# Patient Record
Sex: Female | Born: 1990 | Race: Black or African American | Hispanic: No | Marital: Single | Smoking: Former smoker
Health system: Southern US, Community
[De-identification: ages and names within clinical notes are randomized; demographics above are authoritative.]

## PROBLEM LIST (undated history)

## (undated) DIAGNOSIS — F419 Anxiety disorder, unspecified: Secondary | ICD-10-CM

## (undated) HISTORY — PX: BREAST REDUCTION SURGERY: SHX8

## (undated) HISTORY — DX: Anxiety disorder, unspecified: F41.9

## (undated) HISTORY — PX: ANKLE FRACTURE SURGERY: SHX122

---

## 2015-06-07 HISTORY — PX: REDUCTION MAMMAPLASTY: SUR839

## 2019-09-28 ENCOUNTER — Emergency Department (HOSPITAL_COMMUNITY): Payer: BC Managed Care – PPO

## 2019-09-28 ENCOUNTER — Emergency Department (HOSPITAL_COMMUNITY)
Admission: EM | Admit: 2019-09-28 | Discharge: 2019-09-28 | Disposition: A | Discharge status: Home / Self Care | Payer: BC Managed Care – PPO | Attending: Emergency Medicine | Admitting: Emergency Medicine

## 2019-09-28 ENCOUNTER — Other Ambulatory Visit: Payer: Self-pay

## 2019-09-28 ENCOUNTER — Encounter (HOSPITAL_COMMUNITY): Payer: Self-pay | Admitting: Family Medicine

## 2019-09-28 DIAGNOSIS — S8991XA Unspecified injury of right lower leg, initial encounter: Secondary | ICD-10-CM | POA: Diagnosis present

## 2019-09-28 DIAGNOSIS — Y9369 Activity, other involving other sports and athletics played as a team or group: Secondary | ICD-10-CM | POA: Diagnosis not present

## 2019-09-28 DIAGNOSIS — Y92838 Other recreation area as the place of occurrence of the external cause: Secondary | ICD-10-CM | POA: Diagnosis not present

## 2019-09-28 DIAGNOSIS — Y999 Unspecified external cause status: Secondary | ICD-10-CM | POA: Insufficient documentation

## 2019-09-28 DIAGNOSIS — Y9302 Activity, running: Secondary | ICD-10-CM | POA: Insufficient documentation

## 2019-09-28 DIAGNOSIS — T148XXA Other injury of unspecified body region, initial encounter: Secondary | ICD-10-CM

## 2019-09-28 DIAGNOSIS — S8251XA Displaced fracture of medial malleolus of right tibia, initial encounter for closed fracture: Secondary | ICD-10-CM | POA: Diagnosis not present

## 2019-09-28 DIAGNOSIS — W172XXA Fall into hole, initial encounter: Secondary | ICD-10-CM | POA: Diagnosis not present

## 2019-09-28 DIAGNOSIS — S82831A Other fracture of upper and lower end of right fibula, initial encounter for closed fracture: Secondary | ICD-10-CM | POA: Diagnosis not present

## 2019-09-28 DIAGNOSIS — S9304XA Dislocation of right ankle joint, initial encounter: Secondary | ICD-10-CM | POA: Insufficient documentation

## 2019-09-28 DIAGNOSIS — S9306XA Dislocation of unspecified ankle joint, initial encounter: Secondary | ICD-10-CM

## 2019-09-28 LAB — CBC
HCT: 38.6 % (ref 36.0–46.0)
Hemoglobin: 12.7 g/dL (ref 12.0–15.0)
MCH: 28.8 pg (ref 26.0–34.0)
MCHC: 32.9 g/dL (ref 30.0–36.0)
MCV: 87.5 fL (ref 80.0–100.0)
Platelets: 260 10*3/uL (ref 150–400)
RBC: 4.41 MIL/uL (ref 3.87–5.11)
RDW: 14.3 % (ref 11.5–15.5)
WBC: 8.1 10*3/uL (ref 4.0–10.5)
nRBC: 0 % (ref 0.0–0.2)

## 2019-09-28 LAB — I-STAT BETA HCG BLOOD, ED (MC, WL, AP ONLY): I-stat hCG, quantitative: <5 m[IU]/mL (ref <5)

## 2019-09-28 MED ORDER — PROPOFOL 10 MG/ML IV BOLUS
INTRAVENOUS | Status: AC
  Administered 2019-09-28: 200 mg via INTRAVENOUS
  Filled 2019-09-28: qty 20

## 2019-09-28 MED ORDER — MORPHINE SULFATE (PF) 4 MG/ML IV SOLN
4.0000 mg | Freq: Once | INTRAVENOUS | Status: AC
  Administered 2019-09-28: 4 mg via INTRAVENOUS
  Filled 2019-09-28: qty 1

## 2019-09-28 MED ORDER — PROPOFOL 10 MG/ML IV BOLUS
0.5000 mg/kg | Freq: Once | INTRAVENOUS | Status: AC
  Filled 2019-09-28: qty 20

## 2019-09-28 MED ORDER — OXYCODONE HCL 5 MG PO TABS: 5.0000 mg | ORAL_TABLET | ORAL | 0 refills | Status: DC | PRN

## 2019-09-28 MED ORDER — SODIUM CHLORIDE 0.9 % IV BOLUS
500.0000 mL | Freq: Once | INTRAVENOUS | Status: AC
  Administered 2019-09-28: 500 mL via INTRAVENOUS

## 2019-09-28 MED ORDER — OXYCODONE HCL 5 MG PO CAPS: 5.0000 mg | ORAL_CAPSULE | ORAL | 0 refills | Status: DC | PRN

## 2019-09-28 NOTE — ED Notes (Signed)
Consent has been done, all preparations for conscious sedation has been done.

## 2019-09-28 NOTE — Progress Notes (Signed)
Orthopedic Tech Progress Note Patient Details:  Janet Vega 1990-07-17 [id number]  Ortho Devices Type of Ortho Device: Post (short leg) splint, Stirrup splint Ortho Device/Splint Location: rle. applied post reduction with drs assist. Ortho Device/Splint Interventions: Ordered, Application, Adjustment   Post Interventions Patient Tolerated: Well Instructions Provided: Care of device, Adjustment of device   Trinna Post 09/28/2019, 7:44 PM

## 2019-09-28 NOTE — Discharge Instructions (Signed)
Please follow up with orthopedics within 1 week.

## 2019-09-28 NOTE — ED Provider Notes (Signed)
[facility] EMERGENCY DEPARTMENT Provider Note   CSN: [id number] Arrival date & time: 09/28/19  1623     History No chief complaint on file.   Janet Vega is a 29 y.o. female.  Patient is an otherwise healthy 29 y.o female presenting for ankle injury.  Injury occurred this afternoon while patient was play paintball.  Patient reports that she was running to try and avoid being hit when she fell into a ditch and rolled her ankle inwards.  States she felt it pop out of place and then significant pain.  Denies any syncope but states that she started to cry immediately and was in shock.  She looked down at her ankle and realized it was dislocated so came straight to the emergency department.  Denies any other symptoms other than pain.  States she can still feel her foot and when others touch her foot.  Is still able to move her toes.        History reviewed. No pertinent past medical history.  There are no problems to display for this patient.   History reviewed. No pertinent surgical history.   OB History   No obstetric history on file.     History reviewed. No pertinent family history.  Social History   Tobacco Use  . Smoking status: Not on file  Substance Use Topics  . Alcohol use: Not on file  . Drug use: Not on file    Home Medications Prior to Admission medications   Medication Sig Start Date End Date Taking? Authorizing Provider  oxycodone (OXY-IR) 5 MG capsule Take 1 capsule (5 mg total) by mouth every 4 (four) hours as needed. 09/28/19   Oralia Manis, DO    Allergies    Patient has no allergy information on record.  Review of Systems   Review of Systems  Musculoskeletal: Positive for arthralgias.  Neurological: Negative for syncope.    Physical Exam Updated Vital Signs BP 121/89   Pulse 84   Temp 98.8 F (37.1 C) (Oral)   Resp 18   Ht 5\' 5"  (1.651 m)   Wt 83 kg   SpO2 100%   BMI 30.45 kg/m   Physical Exam  Constitutional:      General: She is not in acute distress.    Appearance: She is normal weight.  HENT:     Head: Normocephalic.     Nose: Nose normal.  Eyes:     Conjunctiva/sclera: Conjunctivae normal.  Cardiovascular:     Rate and Rhythm: Normal rate and regular rhythm.     Pulses: Normal pulses.     Heart sounds: No murmur. No friction rub. No gallop.   Pulmonary:     Effort: Pulmonary effort is normal. No respiratory distress.     Breath sounds: Normal breath sounds. No wheezing, rhonchi or rales.  Abdominal:     General: Bowel sounds are normal.     Tenderness: There is no abdominal tenderness.  Musculoskeletal:        General: Tenderness and deformity present.     Right foot: Decreased range of motion. Deformity and tenderness present.     Comments: Patient with right ankle dislocation. Able to feel thready pedal pulse. Tenderness to palpation of ankle as well as directly above malleolus.   Neurological:     Mental Status: She is alert.     ED Results / Procedures / Treatments   Labs (all labs ordered are listed, but only abnormal results are displayed) Labs  Reviewed  CBC  I-STAT BETA HCG BLOOD, ED ([facility], [facility], [facility] ONLY)    EKG None  Radiology DG Tibia/Fibula Right Port  Result Date: 09/28/2019 CLINICAL DATA:  Slipped and fell EXAM: PORTABLE RIGHT TIBIA AND FIBULA - 2 VIEW COMPARISON:  None. FINDINGS: Frontal and lateral views of the right tibia and fibula are obtained. There is dorsal and lateral dislocation of the talus relative to the tibial plafond. Comminuted displaced posterior and lateral malleolar fractures are seen. The remainder of the tibia and fibula appear unremarkable. There is diffuse soft tissue swelling about the ankle. IMPRESSION: 1. Comminuted lateral malleolar fracture. 2. Comminuted posterior malleolar tibial fracture. 3. Diffuse soft tissue swelling about the ankle. Electronically Signed   By: Sharlet Salina M.D.   On: 09/28/2019 18:10   DG Ankle  Right Port  Result Date: 09/28/2019 CLINICAL DATA:  Reduction of right ankle fracture EXAM: PORTABLE RIGHT ANKLE - 2 VIEW COMPARISON:  September 28, 2019 FINDINGS: There is been interval reduction of the patient's ankle fractures and dislocation. The patient is in a cast limiting fine cortical detail. There is mild displacement of the fibular fracture best seen on the lateral view, significantly improved in the interval. No other acute abnormalities. IMPRESSION: Reduction of right ankle fractures as above. Electronically Signed   By: Gerome Sam III M.D   On: 09/28/2019 20:11   DG Ankle Right Port  Result Date: 09/28/2019 CLINICAL DATA:  Slipped and fell EXAM: PORTABLE RIGHT ANKLE - 2 VIEW COMPARISON:  None. FINDINGS: Frontal and lateral views of the right ankle are obtained. There is a comminuted oblique lateral malleolar fracture. Coronally oriented posterior malleolar fracture also noted. There is dorsal and lateral dislocation of the talus relative to the tibial plafond. I cannot exclude a small nondisplaced avulsion fracture off the medial malleolus. Repeat imaging after reduction of dislocation is recommended. There is diffuse soft tissue edema. IMPRESSION: 1. Dorsal and lateral dislocation of the tibiotalar joint. 2. Comminuted displaced lateral and posterior malleolar fractures. Suspect medial malleolar avulsion fracture. Repeat imaging recommended after reduction. Electronically Signed   By: Sharlet Salina M.D.   On: 09/28/2019 18:09   DG Foot 2 Views Right  Result Date: 09/28/2019 CLINICAL DATA:  Slipped and fell EXAM: RIGHT FOOT - 2 VIEW COMPARISON:  None. FINDINGS: Frontal and lateral views of the right foot demonstrate dorsal and lateral dislocation of the talus from the tibial plafond. Comminuted lateral and posterior malleolar fractures are seen. No other displaced right foot fractures. Diffuse soft tissue swelling of the hindfoot. IMPRESSION: 1. Dislocation of the tibiotalar joint. 2.  Lateral and posterior malleolar fractures. Electronically Signed   By: Sharlet Salina M.D.   On: 09/28/2019 18:11    Procedures Procedures (including critical care time)  Medications Ordered in ED Medications  morphine 4 MG/ML injection 4 mg (4 mg Intravenous Given 09/28/19 1704)  propofol (DIPRIVAN) 10 mg/mL bolus/IV push 41.5 mg (200 mg Intravenous Given 09/28/19 1920)  morphine 4 MG/ML injection 4 mg (4 mg Intravenous Given 09/28/19 1844)  morphine 4 MG/ML injection 4 mg (4 mg Intravenous Given 09/28/19 1950)  sodium chloride 0.9 % bolus 500 mL (500 mLs Intravenous New Bag/Given 09/28/19 1954)    ED Course  I have reviewed the triage vital signs and the nursing notes.  Pertinent labs & imaging results that were available during my care of the patient were reviewed by me and considered in my medical decision making (see chart for details).    MDM Rules/Calculators/A&P  Patient with acute right ankle dislocation.  Continues to be in pain.  Will give IV morphine 4 mg.  We will plan to reevaluate after morphine dose for better exam.  We will also obtain portable films of ankle, foot, tib-fib.  Patient will likely need reduction.  X-rays will determine if it is done in the ED versus the OR.  Discussed this with patient.  Will reevaluate after pain reduction and x-rays.  20:31 Patient tolerated reduction well. See separate procedure note from Dr. Dallie Piles. Repeat imaging showing reduction was successful. Patient currently sitting up and oriented. Reports no dizziness. Tolerating PO. Reports some pain but tolerable per patient. Discussed close f/u with orthopedics and ED return precautions. Patient's friend will drive her. Will give short course of #10 pills oxycodone 5mg  and follow with orthopedics for further management. Crutches provided for patient prior to discharge.   Patient discussed with Dr. Reather Converse who also saw patient and assisted with reduction. Reduction performed by  Dr. Dallie Piles  Final Clinical Impression(s) / ED Diagnoses Final diagnoses:  Dislocation closed  Dislocation of right ankle joint, initial encounter  Ankle dislocation  Closed fracture of distal end of right fibula, unspecified fracture morphology, initial encounter    Rx / DC Orders ED Discharge Orders         Ordered    oxycodone (OXY-IR) 5 MG capsule  Every 4 hours PRN     09/28/19 2030           Caroline More, DO 09/28/19 2037    Elnora Morrison, MD 09/29/19 [phone]

## 2019-09-28 NOTE — ED Provider Notes (Signed)
.Sedation  Date/Time: 09/28/2019 7:51 PM Performed by: Filbert Berthold, MD Authorized by: Elnora Morrison, MD   Consent:    Consent obtained:  Verbal and written   Consent given by:  Patient   Risks discussed:  Allergic reaction, dysrhythmia, inadequate sedation, vomiting, respiratory compromise necessitating ventilatory assistance and intubation, prolonged hypoxia resulting in organ damage and nausea   Alternatives discussed:  Analgesia without sedation and regional anesthesia Universal protocol:    Procedure explained and questions answered to patient or proxy's satisfaction: yes     Relevant documents present and verified: yes     Test results available and properly labeled: yes     Imaging studies available: yes     Required blood products, implants, devices, and special equipment available: yes     Site/side marked: yes     Immediately prior to procedure a time out was called: yes     Patient identity confirmation method:  Arm band and verbally with patient Indications:    Procedure performed:  Fracture reduction   Procedure necessitating sedation performed by:  Physician performing sedation Pre-sedation assessment:    Time since last food or drink:  6   ASA classification: class 1 - normal, healthy patient     Neck mobility: normal     Mouth opening:  3 or more finger widths   Thyromental distance:  3 finger widths   Mallampati score:  I - soft palate, uvula, fauces, pillars visible   Pre-sedation assessments completed and reviewed: airway patency, cardiovascular function, hydration status, mental status, nausea/vomiting, pain level and respiratory function     Pre-sedation assessment completed:  09/28/2019 7:02 PM Immediate pre-procedure details:    Reassessment: Patient reassessed immediately prior to procedure     Reviewed: vital signs     Verified: bag valve mask available, emergency equipment available, intubation equipment available, IV patency confirmed, oxygen available  and suction available   Procedure details (see MAR for exact dosages):    Preoxygenation:  Nasal cannula   Sedation:  Propofol   Intended level of sedation: deep   Analgesia:  Morphine   Intra-procedure monitoring:  Cardiac monitor, blood pressure monitoring, continuous capnometry, continuous pulse oximetry, frequent vital sign checks and frequent LOC assessments   Total Provider sedation time (minutes):  25 Post-procedure details:    Post-sedation assessment completed:  09/28/2019 8:03 PM   Attendance: Constant attendance by certified staff until patient recovered     Recovery: Patient returned to pre-procedure baseline     Post-sedation assessments completed and reviewed: airway patency, cardiovascular function, hydration status, mental status, nausea/vomiting, pain level and respiratory function     Patient is stable for discharge or admission: yes     Patient tolerance:  Tolerated well, no immediate complications  Reduction of dislocation  Date/Time: 09/28/2019 8:04 PM Performed by: Filbert Berthold, MD Authorized by: Elnora Morrison, MD  Consent: Verbal consent obtained. Written consent obtained. Risks and benefits: risks, benefits and alternatives were discussed Consent given by: patient Patient understanding: patient states understanding of the procedure being performed Patient consent: the patient's understanding of the procedure matches consent given Procedure consent: procedure consent matches procedure scheduled Relevant documents: relevant documents present and verified Test results: test results available and properly labeled Site marked: the operative site was marked Imaging studies: imaging studies available Required items: required blood products, implants, devices, and special equipment available Patient identity confirmed: verbally with patient and arm band Time out: Immediately prior to procedure a "time out" was called to verify the  correct patient, procedure,  equipment, support staff and site/side marked as required. Local anesthesia used: no  Anesthesia: Local anesthesia used: no  Sedation: Patient sedated: yes Sedatives: propofol Analgesia: morphine Vitals: Vital signs were monitored during sedation.  Patient tolerance: patient tolerated the procedure well with no immediate complications Comments: R ankle fracture dislocation reduction.        Gracy Bruins, MD 09/28/19 Willaim Sheng    Blane Ohara, MD 09/29/19 [phone]

## 2019-09-28 NOTE — ED Notes (Signed)
Radiology was contacted about pt being ready for her xrays.

## 2019-09-28 NOTE — ED Notes (Signed)
Patient verbalizes understanding of discharge instructions. Opportunity for questioning and answers were provided. Armband removed by staff, pt discharged from ED ambulatory.   

## 2022-02-04 IMAGING — DX DG FOOT 2V*R*
2 series · 2 of 2 positions shown · non-contrast
Comparison: None.

CLINICAL DATA: Slipped and fell

EXAM:
RIGHT FOOT - 2 VIEW

[foot ap]
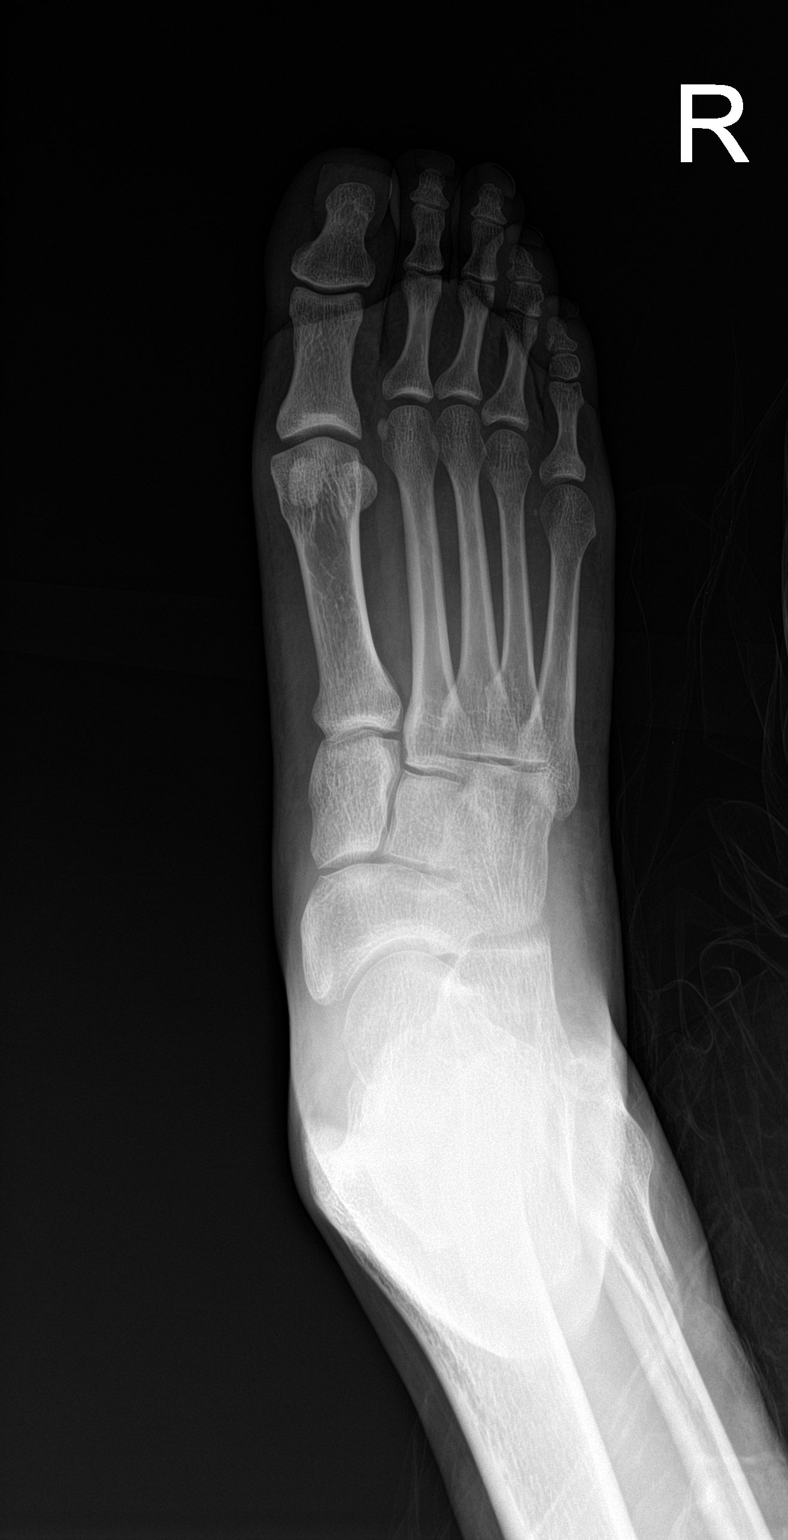

[foot lat]
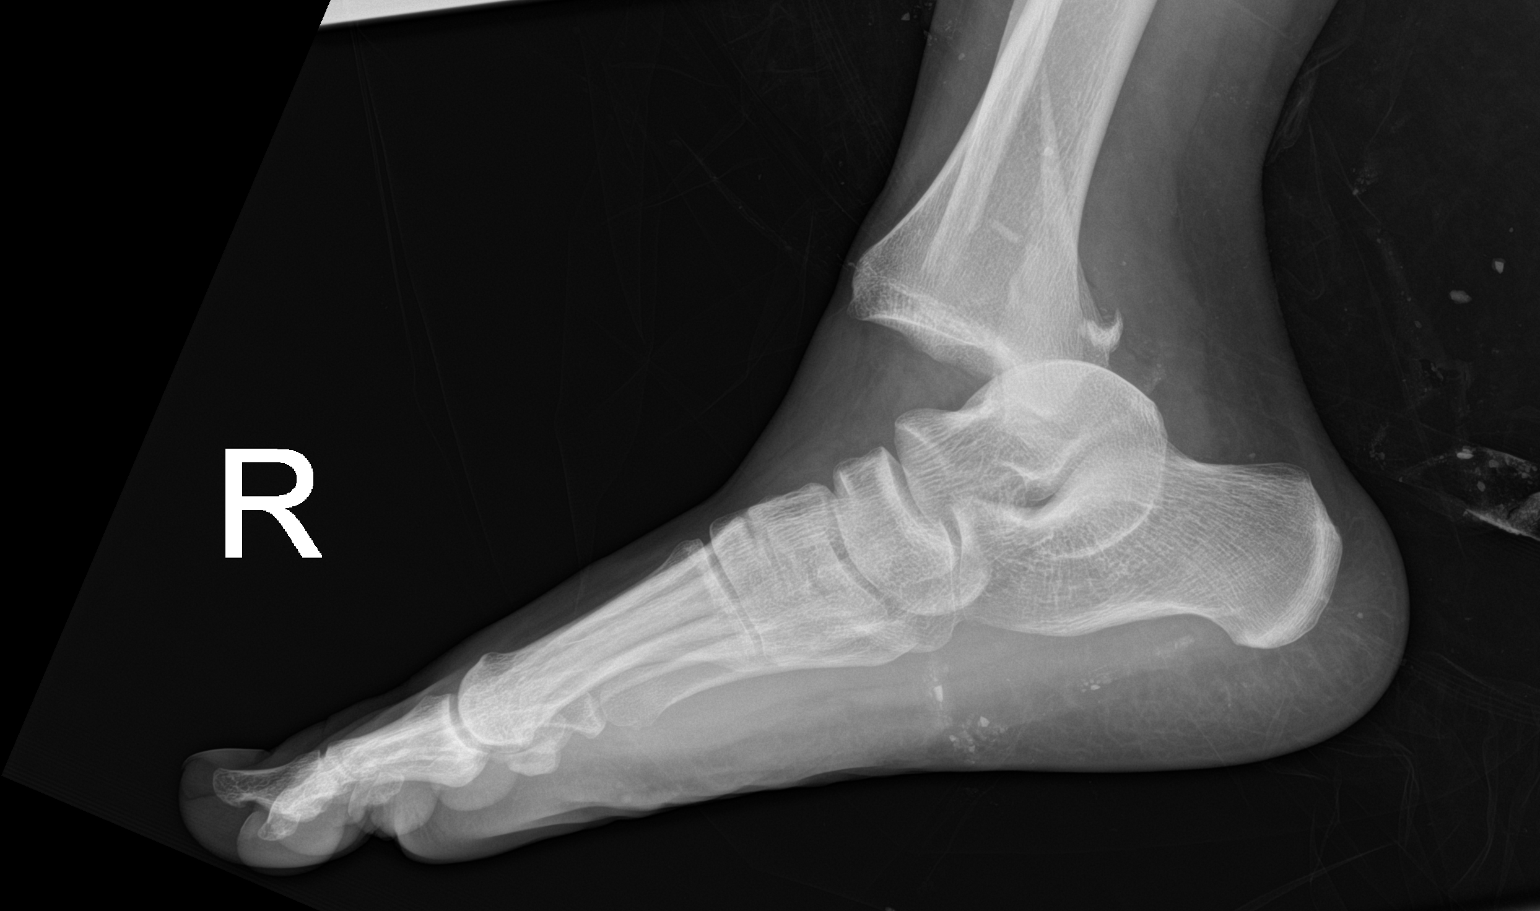

[2 of 2 positions shown; findings below may reference images not displayed]

FINDINGS: Frontal and lateral views of the right foot demonstrate dorsal and
lateral dislocation of the talus from the tibial plafond. Comminuted
lateral and posterior malleolar fractures are seen. No other
displaced right foot fractures. Diffuse soft tissue swelling of the
hindfoot.
IMPRESSION: 1. Dislocation of the tibiotalar joint.
2. Lateral and posterior malleolar fractures.

## 2022-06-24 ENCOUNTER — Telehealth: Payer: Self-pay | Admitting: Gastroenterology

## 2022-06-24 NOTE — Telephone Encounter (Signed)
Good morning Dr. Bryan Lemma,  Supervising MD for today 1/19 AM    We have received records from patients previous [facility] practice [place]. Patient stated she is wanting to transfer her care due to feeling like she is not getting the care or the answers that she needs. Patient stated she is having issues with acid reflux. Patients records include her office visits from June of 2023 until October of 2023. I will be sending patients records for you to review, will you please review and advise on scheduling?   Thank you.

## 2022-06-28 ENCOUNTER — Encounter: Payer: Self-pay | Admitting: Gastroenterology

## 2022-06-28 NOTE — Telephone Encounter (Signed)
Records received and reviewed and notable for the following:  - 03/16/2022: Follow-up appointment in GI clinic for ongoing evaluation/treatment of heartburn and chronic idiopathic constipation.  At that time she was taking Protonix 20 mg twice daily.  Had previously trialed OTC omeprazole.  Index reflux symptoms include heartburn and hoarseness.  Per notes was having efficacy with Protonix bid, to include resolution of hoarseness.    For constipation, index symptoms of type 2-3 Bristol stools approximately 3 days/week with straining to have BM.  Improvement with OTC stool softeners.  Was using Dulcolax on demand.  Symptoms improved with fiber supplement, and by the time of her follow-up appointment in October, was no longer requiring Dulcolax.  Never trialed the prescribed Linzess.  Does have anxiety with regards to her GI symptoms.  Ok to schedule OV for transfer of care with continued evaluation and treatment of reflux.  Per the limited available notes, does not appear that she ever had upper endoscopy or colonoscopy.  Please verify that this is the case.  If she has had either of those, please obtain those records for review prior to appointment.

## 2022-06-28 NOTE — Telephone Encounter (Signed)
Dr. Bryan Lemma,   I spoke with patient and she stated she had never had a colonoscopy or endoscopy!  Patient was scheduled for OV on 2/27 at 2:20

## 2022-08-02 ENCOUNTER — Other Ambulatory Visit (INDEPENDENT_AMBULATORY_CARE_PROVIDER_SITE_OTHER): Payer: BC Managed Care – PPO

## 2022-08-02 ENCOUNTER — Encounter: Payer: Self-pay | Admitting: Gastroenterology

## 2022-08-02 ENCOUNTER — Ambulatory Visit: Payer: BC Managed Care – PPO | Admitting: Gastroenterology

## 2022-08-02 VITALS — BP 110/70 | HR 96 | Ht 65.0 in | Wt 182.2 lb

## 2022-08-02 DIAGNOSIS — K921 Melena: Secondary | ICD-10-CM

## 2022-08-02 DIAGNOSIS — R1084 Generalized abdominal pain: Secondary | ICD-10-CM

## 2022-08-02 DIAGNOSIS — K219 Gastro-esophageal reflux disease without esophagitis: Secondary | ICD-10-CM

## 2022-08-02 DIAGNOSIS — R195 Other fecal abnormalities: Secondary | ICD-10-CM

## 2022-08-02 DIAGNOSIS — R12 Heartburn: Secondary | ICD-10-CM

## 2022-08-02 DIAGNOSIS — R142 Eructation: Secondary | ICD-10-CM

## 2022-08-02 DIAGNOSIS — R933 Abnormal findings on diagnostic imaging of other parts of digestive tract: Secondary | ICD-10-CM

## 2022-08-02 LAB — C-REACTIVE PROTEIN: CRP: <1 mg/dL (ref 0.5–20.0)

## 2022-08-02 LAB — SEDIMENTATION RATE: Sed Rate: 42 mm/hr — ABNORMAL HIGH (ref 0–20)

## 2022-08-02 NOTE — Progress Notes (Signed)
Chief Complaint: GERD, heartburn, abdominal pain, change in bowel habits   Referring Provider:     Ozella Rocks, MD    HPI:     Janet Vega is a 32 y.o. female with a history of anxiety referred to the Gastroenterology Clinic for second opinion and evaluation of GERD and heartburn, but also with her bowel habits as below.  GI sxs started somewhat abruptly in 10/2021.  A few months prior to that, had started colon cleanse monthly with laxatives and protein shakes (5/day) as an attempt for overall health improvement and weight loss. Reflux sxs started to worsen (belching, heartburn). Started acid suppression meds. Went to ER in June 2023, was diagnosed with GERD, treated w/ GI cocktail. Then started to notice intermittent BRB mixed in the stool. Stool cards were + in 1/3. BRB resolved for a few months then recurred a few months later, described as BRB mixed in and on stool. Last episode was last month.  Tried fiber supplement    Previously following with [facility]ena Goes in [place], but transitioning care here due to ongoing GI symptoms. - 03/16/2022: Follow-up appointment in GI clinic for ongoing evaluation/treatment of heartburn and chronic idiopathic constipation.  At that time she was taking Protonix 20 mg twice daily.  Had previously trialed OTC omeprazole.  Index reflux symptoms include heartburn and hoarseness.  Per notes was having efficacy with Protonix bid, to include resolution of hoarseness.      -07/03/2022: ER evaluation for abdominal pain.  Described as intermittent crampy, upper abdominal pain since 10/2021.  Unremarkable, HD stable.       -Normal lipase, CMP, CBC       -Urine culture with E. coli infection       -CT A/P: Mild wall thickening in the sigmoid colon without pericolonic stranding.  May represent nonspecific colitis.  Otherwise normal GI tract.  Hepatic steatosis.       -Was treated with GI cocktail with improvement and discharged with  Cipro 500 mg bid x 7 days for E. coli UTI  Can have AM globus sensation, which tends to improve during the day. Has been taking pantoprazole 20 mg since ER.   Does not have constipation. Stools are formed, and no straining to have BM.  She does have periodic loose/watery stools.   Continues to have generalized abdominal pain.  Does have significant anxiety with regards to her GI symptoms.    No previous EGD/Colonoscopy.   No known family history of CRC, GI malignancy, liver disease, pancreatic disease, or IBD.    Past Medical History:  Diagnosis Date   Anxiety      Past Surgical History:  Procedure Laterality Date   ANKLE FRACTURE SURGERY Right    BREAST REDUCTION SURGERY     Family History  Problem Relation Age of Onset   Glaucoma Mother    Gallstones Mother    Diabetes Father    Hypertension Father    Glaucoma Brother    Bladder Cancer Maternal Grandmother    Diabetes Maternal Grandmother    Heart disease Maternal Grandfather    Breast cancer Paternal Grandmother    Cirrhosis Paternal Grandfather    Social History   Tobacco Use   Smoking status: Former    Types: Cigarettes    Quit date: 2021    Years since quitting: 3.1   Smokeless tobacco: Never   Tobacco comments:    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     Flaxseed, Linseed, (FLAX SEED OIL PO) Take 1 tablet by mouth as needed.     Multiple Vitamin (MULTIVITAMIN) capsule Take 1 capsule by mouth as needed.     pantoprazole (PROTONIX) 20 MG tablet Take 20 mg by mouth daily.     TURMERIC PO Take 1 tablet by mouth as needed.     No current facility-administered medications for this visit.   No Known Allergies   Review of Systems: All systems reviewed and negative except  where noted in HPI.     Physical Exam:    Wt Readings from Last 3 Encounters:  08/02/22 182 lb 4 oz (82.7 kg)  09/28/19 183 lb (83 kg)    BP 110/70 (BP Location: Left Arm, Patient Position: Sitting, Cuff Size: Normal)   Pulse 96   Ht '5\' 5"'$  (1.651 m)   Wt 182 lb 4 oz (82.7 kg)   LMP 07/29/2022   BMI 30.33 kg/m  Constitutional:  Pleasant, in no acute distress. Psychiatric: Normal mood and affect. Behavior is normal. EENT: Pupils normal.  Conjunctivae are normal. No scleral icterus. Neck supple. No cervical LAD. Cardiovascular: Normal rate, regular rhythm. No edema Pulmonary/chest: Effort normal and breath sounds normal. No wheezing, rales or rhonchi. Abdominal: Soft, nondistended, nontender. Bowel sounds active throughout. There are no masses palpable. No hepatomegaly. Neurological: Alert and oriented to person place and time. Skin: Skin is warm and dry. No rashes noted.   ASSESSMENT AND PLAN;   1) Heartburn 2) Belching 3) Upper abdominal pain 4) Change in bowel habits 5) Scant hematochezia 6) Lower abdominal pain 7) Abnormal imaging study 8) Heme positive stool  Discussed the broad DDx for presenting symptoms, to include primary GI pathologies along with potential secondary symptomatology from underlying anxiety.  Symptoms are certainly causing her significant distress, and she would very strongly like to proceed with endoscopic evaluation.  Plan as follows:  - Check ESR, CRP, fecal calprotectin, fecal pancreatic elastase - EGD with gastric and duodenal biopsies - Colonoscopy with random and directed biopsies - Ok to continue pantoprazole 20 mg daily  The indications, risks, and benefits of EGD and colonoscopy were explained to the patient in detail. Risks include but are not limited to bleeding, perforation, adverse reaction to medications, and cardiopulmonary compromise. Sequelae include but are not limited to the possibility of surgery, hospitalization, and mortality.  The patient verbalized understanding and wished to proceed. All questions answered, referred to scheduler and bowel prep ordered. Further recommendations pending results of the exam.     [place], DO, FACG  08/02/2022, 2:24 PM   No ref. provider found

## 2022-08-02 NOTE — Patient Instructions (Addendum)
Your provider has requested that you go to the basement level for lab work before leaving today. Press "B" on the elevator. The lab is located at the first door on the left as you exit the elevator.   We have given you a sample of Clenpiq for Colonoscopy prep. _______________________________________________________  If your blood pressure at your visit was 140/90 or greater, please contact your primary care physician to follow up on this.  You have been scheduled for an endoscopy and colonoscopy. Please follow the written instructions given to you at your visit today. Please pick up your prep supplies at the pharmacy within the next 1-3 days. If you use inhalers (even only as needed), please bring them with you on the day of your procedure.   ______________________________________________________  If you are age 52 or younger, your body mass index should be between 19-25. Your Body mass index is 30.33 kg/m. If this is out of the aformentioned range listed, please consider follow up with your Primary Care Provider.   __________________________________________________________  The [facility] providers would like to encourage you to use [facility] to communicate with providers for non-urgent requests or questions.  Due to long hold times on the telephone, sending your provider a message by [facility] may be a faster and more efficient way to get a response.  Please allow 48 business hours for a response.  Please remember that this is for non-urgent requests.   Due to recent changes in healthcare laws, you may see the results of your imaging and laboratory studies on MyChart before your provider has had a chance to review them.  We understand that in some cases there may be results that are confusing or concerning to you. Not all laboratory results come back in the same time frame and the provider may be waiting for multiple results in order to interpret others.  Please give [place] 48 hours in order for your  provider to thoroughly review all the results before contacting the office for clarification of your results.    Thank you for choosing me and [facility].  Vito Cirigliano, D.O.

## 2022-08-04 ENCOUNTER — Other Ambulatory Visit: Payer: BC Managed Care – PPO

## 2022-08-04 DIAGNOSIS — R1084 Generalized abdominal pain: Secondary | ICD-10-CM

## 2022-08-04 DIAGNOSIS — R195 Other fecal abnormalities: Secondary | ICD-10-CM

## 2022-08-04 DIAGNOSIS — K219 Gastro-esophageal reflux disease without esophagitis: Secondary | ICD-10-CM

## 2022-08-04 DIAGNOSIS — R12 Heartburn: Secondary | ICD-10-CM

## 2022-08-04 DIAGNOSIS — K921 Melena: Secondary | ICD-10-CM

## 2022-08-09 LAB — CALPROTECTIN, FECAL: Calprotectin, Fecal: 25 ug/g (ref 0–120)

## 2022-08-10 ENCOUNTER — Encounter: Payer: Self-pay | Admitting: Gastroenterology

## 2022-08-10 ENCOUNTER — Ambulatory Visit (AMBULATORY_SURGERY_CENTER): Payer: BC Managed Care – PPO | Admitting: Gastroenterology

## 2022-08-10 VITALS — BP 104/62 | HR 75 | Temp 98.6°F | Resp 18 | Ht 65.0 in | Wt 182.0 lb

## 2022-08-10 DIAGNOSIS — R1084 Generalized abdominal pain: Secondary | ICD-10-CM

## 2022-08-10 DIAGNOSIS — R142 Eructation: Secondary | ICD-10-CM

## 2022-08-10 DIAGNOSIS — K219 Gastro-esophageal reflux disease without esophagitis: Secondary | ICD-10-CM | POA: Diagnosis not present

## 2022-08-10 DIAGNOSIS — F458 Other somatoform disorders: Secondary | ICD-10-CM

## 2022-08-10 DIAGNOSIS — K2950 Unspecified chronic gastritis without bleeding: Secondary | ICD-10-CM

## 2022-08-10 DIAGNOSIS — R195 Other fecal abnormalities: Secondary | ICD-10-CM

## 2022-08-10 DIAGNOSIS — K921 Melena: Secondary | ICD-10-CM | POA: Diagnosis not present

## 2022-08-10 DIAGNOSIS — K640 First degree hemorrhoids: Secondary | ICD-10-CM

## 2022-08-10 MED ORDER — SODIUM CHLORIDE 0.9 % IV SOLN: 500.0000 mL | Freq: Once | INTRAVENOUS | Status: DC

## 2022-08-10 NOTE — Progress Notes (Signed)
Called to room to assist during endoscopic procedure.  Patient ID and intended procedure confirmed with present staff. Received instructions for my participation in the procedure from the performing physician.  

## 2022-08-10 NOTE — Progress Notes (Unsigned)
GASTROENTEROLOGY PROCEDURE H&P NOTE   Primary Care Physician: Patient, No Pcp Per    Reason for Procedure:   GERD, heartburn, abdominal pain, change in bowel habits, hematochezia, globus sensation, belching, FOBT+ stools  Plan:    EGD, colonoscopy  Patient is appropriate for endoscopic procedure(s) in the ambulatory ([facility]) setting.  The nature of the procedure, as well as the risks, benefits, and alternatives were carefully and thoroughly reviewed with the patient. Ample time for discussion and questions allowed. The patient understood, was satisfied, and agreed to proceed.     HPI: Janet Vega is a 32 y.o. female who presents for EGD and colonoscopy for evaluation of GERD, heartburn, abdominal pain, change in bowel habits.  Patient was most recently seen in the Gastroenterology Clinic on 08/02/2022 by me.  No interval change in medical history since that appointment. Please refer to that note for full details regarding GI history and clinical presentation.   Past Medical History:  Diagnosis Date   Anxiety     Past Surgical History:  Procedure Laterality Date   ANKLE FRACTURE SURGERY Right    BREAST REDUCTION SURGERY      Prior to Admission medications   Medication Sig Start Date End Date Taking? Authorizing Provider  ELDERBERRY PO Take 1 tablet by mouth as needed.   Yes [provider]  Flaxseed, Linseed, (FLAX SEED OIL PO) Take 1 tablet by mouth as needed.   Yes [provider]  Multiple Vitamin (MULTIVITAMIN) capsule Take 1 capsule by mouth as needed.   Yes [provider]  TURMERIC PO Take 1 tablet by mouth as needed.   Yes [provider]  levonorgestrel (MIRENA, 52 MG,) 20 MCG/DAY IUD Device supplied by [employer]. 07/28/22   [provider]  pantoprazole (PROTONIX) 20 MG tablet Take 20 mg by mouth daily.    [provider]    Current Outpatient Medications  Medication Sig Dispense Refill   ELDERBERRY PO Take  1 tablet by mouth as needed.     Flaxseed, Linseed, (FLAX SEED OIL PO) Take 1 tablet by mouth as needed.     Multiple Vitamin (MULTIVITAMIN) capsule Take 1 capsule by mouth as needed.     TURMERIC PO Take 1 tablet by mouth as needed.     levonorgestrel (MIRENA, 52 MG,) 20 MCG/DAY IUD Device supplied by [employer].     pantoprazole (PROTONIX) 20 MG tablet Take 20 mg by mouth daily.     Current Facility-Administered Medications  Medication Dose Route Frequency Provider Last Rate Last Admin   0.9 %  sodium chloride infusion  500 mL Intravenous Once Elina Streng V, DO        Allergies as of 08/10/2022   (No Known Allergies)    Family History  Problem Relation Age of Onset   Glaucoma Mother    Gallstones Mother    Diabetes Father    Hypertension Father    Glaucoma Brother    Bladder Cancer Maternal Grandmother    Diabetes Maternal Grandmother    Heart disease Maternal Grandfather    Breast cancer Paternal Grandmother    Cirrhosis Paternal Grandfather     Social History   Socioeconomic History   Marital status: Single    Spouse name: Not on file   Number of children: 1   Years of education: Not on file   Highest education level: Not on file  Occupational History   Occupation: project managment  Tobacco Use   Smoking status: Former  Types: Cigarettes    Quit date: 2021    Years since quitting: 3.1   Smokeless tobacco: Never   Tobacco comments:    College years  Vaping Use   Vaping Use: Some days   Devices: Hooka  Substance and Sexual Activity   Alcohol use: Yes    Comment: occasional   Drug use: Never   Sexual activity: Not on file  Other Topics Concern   Not on file  Social History Narrative   Not on file   Social Determinants of Health   Financial Resource Strain: Not on file  Food Insecurity: Not on file  Transportation Needs: Not on file  Physical Activity: Not on file  Stress: Not on file  Social Connections: Not on file  Intimate Partner  Violence: Not on file    Physical Exam: Vital signs in last 24 hours: '@BP'$  139/80   Pulse 89   Temp 98.6 F (37 C)   Ht '5\' 5"'$  (1.651 m)   Wt 182 lb (82.6 kg)   LMP 07/29/2022   SpO2 100%   BMI 30.29 kg/m  GEN: NAD EYE: Sclerae anicteric ENT: MMM CV: Non-tachycardic Pulm: CTA b/l GI: Soft, NT/ND NEURO:  Alert & Oriented x 3   Gerrit Heck, DO [facility]   08/10/2022 11:32 AM

## 2022-08-10 NOTE — Op Note (Signed)
[place] Patient Name: Janet Vega Procedure Date: 08/10/2022 11:35 AM MRN: [id number] Endoscopist: Gerrit Heck , MD, [id number] Age: 32 Referring MD:  Date of Birth: March 24, 1991 Gender: Female Account #: [id number] Procedure:                Colonoscopy Indications:              Hematochezia, Heme positive stool, Change in bowel                            habits, Loose stools/Diarrhea Medicines:                Monitored Anesthesia Care Procedure:                Pre-Anesthesia Assessment:                           - Prior to the procedure, a History and Physical                            was performed, and patient medications and                            allergies were reviewed. The patient's tolerance of                            previous anesthesia was also reviewed. The risks                            and benefits of the procedure and the sedation                            options and risks were discussed with the patient.                            All questions were answered, and informed consent                            was obtained. Prior Anticoagulants: The patient has                            taken no anticoagulant or antiplatelet agents. ASA                            Grade Assessment: II - A patient with mild systemic                            disease. After reviewing the risks and benefits,                            the patient was deemed in satisfactory condition to                            undergo the procedure.  After obtaining informed consent, the colonoscope                            was passed under direct vision. Throughout the                            procedure, the patient's blood pressure, pulse, and                            oxygen saturations were monitored continuously. The                            Olympus Colonoscope [id number] was introduced through                            the anus and advanced to  the 10 cm into the ileum.                            The colonoscopy was performed without difficulty.                            The patient tolerated the procedure well. The                            quality of the bowel preparation was excellent. The                            terminal ileum, ileocecal valve, appendiceal                            orifice, and rectum were photographed. Scope In: 11:49:27 AM Scope Out: 12:03:29 PM Scope Withdrawal Time: 0 hours 9 minutes 51 seconds  Total Procedure Duration: 0 hours 14 minutes 2 seconds  Findings:                 Skin tags were found on perianal exam.                           The colon (entire examined portion) appeared                            normal. Biopsies for histology were taken with a                            cold forceps from the right colon and left colon                            for evaluation of microscopic colitis. Estimated                            blood loss was minimal.                           Non-bleeding internal hemorrhoids were found during  retroflexion. The hemorrhoids were small.                           The terminal ileum appeared normal. Complications:            No immediate complications. Estimated Blood Loss:     Estimated blood loss was minimal. Impression:               - Perianal skin tags found on perianal exam.                           - The entire examined colon is normal. Biopsied.                           - Non-bleeding internal hemorrhoids.                           - The examined portion of the ileum was normal. Recommendation:           - Patient has a contact number available for                            emergencies. The signs and symptoms of potential                            delayed complications were discussed with the                            patient. Return to normal activities tomorrow.                            Written discharge instructions  were provided to the                            patient.                           - Resume previous diet.                           - Continue present medications.                           - Await pathology results.                           - Repeat colonoscopy at age 35 for screening                            purposes.                           - Return to GI office PRN.                           - Use fiber, for example Citrucel, Fibercon, [employer]  or Metamucil.                           - Trial course of OTC IBGuard. Gerrit Heck, MD 08/10/2022 12:12:09 PM

## 2022-08-10 NOTE — Op Note (Signed)
[place] Patient Name: Janet Vega Procedure Date: 08/10/2022 11:36 AM MRN: [id number] Endoscopist: Gerrit Heck , MD, [id number] Age: 32 Referring MD:  Date of Birth: 15-Sep-1990 Gender: Female Account #: [id number] Procedure:                Upper GI endoscopy Indications:              Heartburn, Suspected esophageal reflux, Change in                            stools, Globus sensation, Belching Medicines:                Monitored Anesthesia Care Procedure:                Pre-Anesthesia Assessment:                           - Prior to the procedure, a History and Physical                            was performed, and patient medications and                            allergies were reviewed. The patient's tolerance of                            previous anesthesia was also reviewed. The risks                            and benefits of the procedure and the sedation                            options and risks were discussed with the patient.                            All questions were answered, and informed consent                            was obtained. Prior Anticoagulants: The patient has                            taken no anticoagulant or antiplatelet agents. ASA                            Grade Assessment: II - A patient with mild systemic                            disease. After reviewing the risks and benefits,                            the patient was deemed in satisfactory condition to                            undergo the procedure.  After obtaining informed consent, the endoscope was                            passed under direct vision. Throughout the                            procedure, the patient's blood pressure, pulse, and                            oxygen saturations were monitored continuously. The                            Olympus Scope [id number] was introduced through the                            mouth, and  advanced to the third part of duodenum.                            The upper GI endoscopy was accomplished without                            difficulty. The patient tolerated the procedure                            well. Scope In: Scope Out: Findings:                 The examined esophagus was normal.                           The Z-line was regular and was found 35 cm from the                            incisors.                           The entire examined stomach was normal. Biopsies                            were taken with a cold forceps for Helicobacter                            pylori testing. Estimated blood loss was minimal.                           The examined duodenum was normal. Biopsies were                            taken with a cold forceps for histology. Estimated                            blood loss was minimal. Complications:            No immediate complications. Estimated Blood Loss:     Estimated blood loss was minimal. Impression:               -  Normal esophagus.                           - Z-line regular, 35 cm from the incisors.                           - Normal stomach. Biopsied.                           - Normal examined duodenum. Biopsied. Recommendation:           - Patient has a contact number available for                            emergencies. The signs and symptoms of potential                            delayed complications were discussed with the                            patient. Return to normal activities tomorrow.                            Written discharge instructions were provided to the                            patient.                           - Resume previous diet.                           - Continue present medications.                           - Await pathology results. Gerrit Heck, MD 08/10/2022 12:08:37 PM

## 2022-08-10 NOTE — Progress Notes (Unsigned)
Vss nad trans to pacu °

## 2022-08-10 NOTE — Patient Instructions (Signed)
Discharge instructions given. Biopsies taken. See Recommendations on report. Resume previous medications. YOU HAD AN ENDOSCOPIC PROCEDURE TODAY AT [facility]:   Refer to the procedure report that was given to you for any specific questions about what was found during the examination.  If the procedure report does not answer your questions, please call your gastroenterologist to clarify.  If you requested that your care partner not be given the details of your procedure findings, then the procedure report has been included in a sealed envelope for you to review at your convenience later.  YOU SHOULD EXPECT: Some feelings of bloating in the abdomen. Passage of more gas than usual.  Walking can help get rid of the air that was put into your GI tract during the procedure and reduce the bloating. If you had a lower endoscopy (such as a colonoscopy or flexible sigmoidoscopy) you may notice spotting of blood in your stool or on the toilet paper. If you underwent a bowel prep for your procedure, you may not have a normal bowel movement for a few days.  Please Note:  You might notice some irritation and congestion in your nose or some drainage.  This is from the oxygen used during your procedure.  There is no need for concern and it should clear up in a day or so.  SYMPTOMS TO REPORT IMMEDIATELY:  Following lower endoscopy (colonoscopy or flexible sigmoidoscopy):  Excessive amounts of blood in the stool  Significant tenderness or worsening of abdominal pains  Swelling of the abdomen that is new, acute  Fever of 100F or higher  Following upper endoscopy (EGD)  Vomiting of blood or coffee ground material  New chest pain or pain under the shoulder blades  Painful or persistently difficult swallowing  New shortness of breath  Fever of 100F or higher  Black, tarry-looking stools  For urgent or emergent issues, a gastroenterologist can be reached at any hour by calling [phone]  [phone]. Do not use MyChart messaging for urgent concerns.    DIET:  We do recommend a small meal at first, but then you may proceed to your regular diet.  Drink plenty of fluids but you should avoid alcoholic beverages for 24 hours.  ACTIVITY:  You should plan to take it easy for the rest of today and you should NOT DRIVE or use heavy machinery until tomorrow (because of the sedation medicines used during the test).    FOLLOW UP: Our staff will call the number listed on your records the next business day following your procedure.  We will call around 7:15- 8:00 am to check on you and address any questions or concerns that you may have regarding the information given to you following your procedure. If we do not reach you, we will leave a message.     If any biopsies were taken you will be contacted by phone or by letter within the next 1-3 weeks.  Please call us at [phone] if you have not heard about the biopsies in 3 weeks.    SIGNATURES/CONFIDENTIALITY: You and/or your care partner have signed paperwork which will be entered into your electronic medical record.  These signatures attest to the fact that that the information above on your After Visit Summary has been reviewed and is understood.  Full responsibility of the confidentiality of this discharge information lies with you and/or your care-partner.

## 2022-08-10 NOTE — Progress Notes (Signed)
Pt's states no medical or surgical changes since previsit or office visit. 

## 2022-08-11 ENCOUNTER — Telehealth: Payer: Self-pay | Admitting: *Deleted

## 2022-08-11 NOTE — Telephone Encounter (Signed)
  Follow up Call-     08/10/2022   10:38 AM  Call back number  Post procedure Call Back phone  # [phone]  Permission to leave phone message Yes     Patient questions:  Do you have a fever, pain , or abdominal swelling? No. Pain Score  0 *  Have you tolerated food without any problems? Yes.    Have you been able to return to your normal activities? Yes.    Do you have any questions about your discharge instructions: Diet   No. Medications  No. Follow up visit  No.  Do you have questions or concerns about your Care? No.  Actions: * If pain score is 4 or above: No action needed, pain <4.

## 2022-08-12 LAB — PANCREATIC ELASTASE, FECAL: Pancreatic Elastase-1, Stool: >500 mcg/g

## 2022-12-13 ENCOUNTER — Other Ambulatory Visit: Payer: Self-pay | Admitting: Obstetrics and Gynecology

## 2022-12-13 DIAGNOSIS — N644 Mastodynia: Secondary | ICD-10-CM

## 2022-12-26 ENCOUNTER — Ambulatory Visit: Admission: RE | Admit: 2022-12-26 | Payer: BC Managed Care – PPO | Source: Ambulatory Visit

## 2022-12-26 ENCOUNTER — Ambulatory Visit
Admission: RE | Admit: 2022-12-26 | Discharge: 2022-12-26 | Disposition: A | Discharge status: Home / Self Care | Payer: BC Managed Care – PPO | Source: Ambulatory Visit | Attending: Obstetrics and Gynecology | Admitting: Obstetrics and Gynecology

## 2022-12-26 ENCOUNTER — Other Ambulatory Visit: Payer: Self-pay | Admitting: Obstetrics and Gynecology

## 2022-12-26 DIAGNOSIS — N6310 Unspecified lump in the right breast, unspecified quadrant: Secondary | ICD-10-CM

## 2022-12-26 DIAGNOSIS — N644 Mastodynia: Secondary | ICD-10-CM

## 2024-04-09 ENCOUNTER — Other Ambulatory Visit: Payer: Self-pay | Admitting: Obstetrics and Gynecology

## 2024-04-09 DIAGNOSIS — Z1231 Encounter for screening mammogram for malignant neoplasm of breast: Secondary | ICD-10-CM

## 2024-04-25 ENCOUNTER — Inpatient Hospital Stay: Admission: RE | Admit: 2024-04-25 | Source: Ambulatory Visit

## 2024-04-25 ENCOUNTER — Ambulatory Visit
Admission: RE | Admit: 2024-04-25 | Discharge: 2024-04-25 | Disposition: A | Discharge status: Home / Self Care | Source: Ambulatory Visit | Attending: Obstetrics and Gynecology | Admitting: Obstetrics and Gynecology

## 2024-04-25 ENCOUNTER — Other Ambulatory Visit: Payer: Self-pay | Admitting: Obstetrics and Gynecology

## 2024-04-25 DIAGNOSIS — N644 Mastodynia: Secondary | ICD-10-CM

## 2024-04-25 DIAGNOSIS — Z1231 Encounter for screening mammogram for malignant neoplasm of breast: Secondary | ICD-10-CM

## 2024-05-08 ENCOUNTER — Ambulatory Visit

## 2024-05-28 ENCOUNTER — Ambulatory Visit
Admission: RE | Admit: 2024-05-28 | Discharge: 2024-05-28 | Disposition: A | Discharge status: Home / Self Care | Source: Ambulatory Visit | Attending: Obstetrics and Gynecology | Admitting: Obstetrics and Gynecology

## 2024-05-28 DIAGNOSIS — N644 Mastodynia: Secondary | ICD-10-CM
